# Patient Record
Sex: Male | Born: 1970
Health system: Northeastern US, Academic
[De-identification: ages and names within clinical notes are randomized; demographics above are authoritative.]

## PROBLEM LIST (undated history)

## (undated) DIAGNOSIS — R319 Hematuria, unspecified: Secondary | ICD-10-CM

## (undated) DIAGNOSIS — E785 Hyperlipidemia, unspecified: Secondary | ICD-10-CM

## (undated) DIAGNOSIS — D6949 Other primary thrombocytopenia: Secondary | ICD-10-CM

## (undated) HISTORY — DX: Hyperlipidemia, unspecified: E78.5

## (undated) HISTORY — DX: Hematuria, unspecified: R31.9

## (undated) HISTORY — DX: Other primary thrombocytopenia: D69.49

---

## 2012-07-31 ENCOUNTER — Encounter: Payer: Self-pay | Admitting: *Deleted

## 2012-08-05 ENCOUNTER — Ambulatory Visit: Payer: Self-pay | Admitting: General Surgery

## 2012-09-09 ENCOUNTER — Encounter: Payer: Self-pay | Admitting: *Deleted

## 2013-07-08 HISTORY — PX: VASECTOMY: SHX75

## 2014-01-05 ENCOUNTER — Encounter: Payer: Self-pay | Admitting: *Deleted

## 2014-01-26 ENCOUNTER — Encounter: Payer: Self-pay | Admitting: *Deleted

## 2014-01-26 ENCOUNTER — Ambulatory Visit: Payer: Self-pay | Admitting: General Surgery

## 2014-08-09 ENCOUNTER — Encounter: Payer: Self-pay | Admitting: Family Medicine

## 2014-08-16 DIAGNOSIS — E785 Hyperlipidemia, unspecified: Secondary | ICD-10-CM | POA: Insufficient documentation

## 2014-08-16 DIAGNOSIS — D6949 Other primary thrombocytopenia: Secondary | ICD-10-CM | POA: Insufficient documentation

## 2014-08-16 DIAGNOSIS — R319 Hematuria, unspecified: Secondary | ICD-10-CM | POA: Insufficient documentation

## 2014-08-31 ENCOUNTER — Encounter: Payer: Self-pay | Admitting: Family Medicine

## 2014-09-22 ENCOUNTER — Encounter: Payer: Self-pay | Admitting: Family Medicine

## 2014-09-24 ENCOUNTER — Encounter: Payer: Self-pay | Admitting: Family Medicine

## 2014-12-28 ENCOUNTER — Ambulatory Visit (INDEPENDENT_AMBULATORY_CARE_PROVIDER_SITE_OTHER): Payer: Self-pay | Admitting: Family Medicine

## 2014-12-28 ENCOUNTER — Encounter: Payer: Self-pay | Admitting: Family Medicine

## 2014-12-28 VITALS — BP 109/80 | HR 76 | Temp 97.9°F | Ht 74.0 in | Wt 201.0 lb

## 2014-12-28 DIAGNOSIS — R319 Hematuria, unspecified: Secondary | ICD-10-CM

## 2014-12-28 DIAGNOSIS — Z0289 Encounter for other administrative examinations: Secondary | ICD-10-CM

## 2014-12-28 DIAGNOSIS — Z0000 Encounter for general adult medical examination without abnormal findings: Secondary | ICD-10-CM

## 2014-12-28 DIAGNOSIS — E785 Hyperlipidemia, unspecified: Secondary | ICD-10-CM

## 2014-12-28 DIAGNOSIS — R809 Proteinuria, unspecified: Secondary | ICD-10-CM

## 2014-12-28 DIAGNOSIS — Z125 Encounter for screening for malignant neoplasm of prostate: Secondary | ICD-10-CM | POA: Insufficient documentation

## 2014-12-28 LAB — MICROSCOPIC EXAMINATION
Epithelial Cells (non renal): NONE SEEN /hpf (ref 0–10)
WBC, UA: NONE SEEN /hpf (ref 0–?)

## 2014-12-28 LAB — UA/M W/RFLX CULTURE, ROUTINE
BILIRUBIN UA: NEGATIVE
GLUCOSE, UA: NEGATIVE
KETONES UA: NEGATIVE
LEUKOCYTES UA: NEGATIVE
Nitrite, UA: NEGATIVE
PROTEIN UA: NEGATIVE
SPEC GRAV UA: 1.015 (ref 1.005–1.030)
Urobilinogen, Ur: 0.2 mg/dL (ref 0.2–1.0)
pH, UA: 7 (ref 5.0–7.5)

## 2014-12-28 NOTE — Assessment & Plan Note (Signed)
Foster care form reviewed with patient, completed today

## 2014-12-28 NOTE — Assessment & Plan Note (Signed)
Check PSA. ?

## 2014-12-28 NOTE — Assessment & Plan Note (Addendum)
Check lipid panel; reviewed last lipid panel with him from [facility]; his issue last lab was the elevated LDL; healthy eating encouraged; he is active and just about healthy weight

## 2014-12-28 NOTE — Patient Instructions (Addendum)
We'll have you see Dr. Edwyna Shell at [facility] Try to limit saturated fats in your diet (bologna, hot dogs, barbeque, cheeseburgers, hamburgers, steak, bacon, sausage, cheese, etc.) and get more fresh fruits, vegetables, and whole grains Return in one year for your next physical Health Maintenance, Male A healthy lifestyle and preventative care can promote health and wellness.  Maintain regular health, dental, and eye exams.  Eat a healthy diet. Foods like vegetables, fruits, whole grains, low-fat dairy products, and lean protein foods contain the nutrients you need and are low in calories. Decrease your intake of foods high in solid fats, added sugars, and salt. Get information about a proper diet from your health care provider, if necessary.  Regular physical exercise is one of the most important things you can do for your health. Most adults should get at least 150 minutes of moderate-intensity exercise (any activity that increases your heart rate and causes you to sweat) each week. In addition, most adults need muscle-strengthening exercises on 2 or more days a week.   Maintain a healthy weight. The body mass index (BMI) is a screening tool to identify possible weight problems. It provides an estimate of body fat based on height and weight. Your health care provider can find your BMI and can help you achieve or maintain a healthy weight. For males 20 years and older:  A BMI below 18.5 is considered underweight.  A BMI of 18.5 to 24.9 is normal.  A BMI of 25 to 29.9 is considered overweight.  A BMI of 30 and above is considered obese.  Maintain normal blood lipids and cholesterol by exercising and minimizing your intake of saturated fat. Eat a balanced diet with plenty of fruits and vegetables. Blood tests for lipids and cholesterol should begin at age 49 and be repeated every 5 years. If your lipid or cholesterol levels are high, you are over age 75, or you are at high risk for heart  disease, you may need your cholesterol levels checked more frequently.Ongoing high lipid and cholesterol levels should be treated with medicines if diet and exercise are not working.  If you smoke, find out from your health care provider how to quit. If you do not use tobacco, do not start.  Lung cancer screening is recommended for adults aged 55-80 years who are at high risk for developing lung cancer because of a history of smoking. A yearly low-dose CT scan of the lungs is recommended for people who have at least a 30-pack-year history of smoking and are current smokers or have quit within the past 15 years. A pack year of smoking is smoking an average of 1 pack of cigarettes a day for 1 year (for example, a 30-pack-year history of smoking could mean smoking 1 pack a day for 30 years or 2 packs a day for 15 years). Yearly screening should continue until the smoker has stopped smoking for at least 15 years. Yearly screening should be stopped for people who develop a health problem that would prevent them from having lung cancer treatment.  If you choose to drink alcohol, do not have more than 2 drinks per day. One drink is considered to be 12 oz (360 mL) of beer, 5 oz (150 mL) of wine, or 1.5 oz (45 mL) of liquor.  Avoid the use of street drugs. Do not share needles with anyone. Ask for help if you need support or instructions about stopping the use of drugs.  High blood pressure causes heart disease  and increases the risk of stroke. High blood pressure is more likely to develop in:  People who have blood pressure in the end of the normal range (100-139/85-89 mm Hg).  People who are overweight or obese.  People who are African American.  If you are 29-75 years of age, have your blood pressure checked every 3-5 years. If you are 69 years of age or older, have your blood pressure checked every year. You should have your blood pressure measured twice--once when you are at a hospital or clinic, and  once when you are not at a hospital or clinic. Record the average of the two measurements. To check your blood pressure when you are not at a hospital or clinic, you can use:  An automated blood pressure machine at a pharmacy.  A home blood pressure monitor.  If you are 87-75 years old, ask your health care provider if you should take aspirin to prevent heart disease.  Diabetes screening involves taking a blood sample to check your fasting blood sugar level. This should be done once every 3 years after age 65 if you are at a normal weight and without risk factors for diabetes. Testing should be considered at a younger age or be carried out more frequently if you are overweight and have at least 1 risk factor for diabetes.  Colorectal cancer can be detected and often prevented. Most routine colorectal cancer screening begins at the age of 52 and continues through age 50. However, your health care provider may recommend screening at an earlier age if you have risk factors for colon cancer. On a yearly basis, your health care provider may provide home test kits to check for hidden blood in the stool. A small camera at the end of a tube may be used to directly examine the colon (sigmoidoscopy or colonoscopy) to detect the earliest forms of colorectal cancer. Talk to your health care provider about this at age 40 when routine screening begins. A direct exam of the colon should be repeated every 5-10 years through age 44, unless early forms of precancerous polyps or small growths are found.  People who are at an increased risk for hepatitis B should be screened for this virus. You are considered at high risk for hepatitis B if:  You were born in a country where hepatitis B occurs often. Talk with your health care provider about which countries are considered high risk.  Your parents were born in a high-risk country and you have not received a shot to protect against hepatitis B (hepatitis B  vaccine).  You have HIV or AIDS.  You use needles to inject street drugs.  You live with, or have sex with, someone who has hepatitis B.  You are a man who has sex with other men (MSM).  You get hemodialysis treatment.  You take certain medicines for conditions like cancer, organ transplantation, and autoimmune conditions.  Hepatitis C blood testing is recommended for all people born from [birth year] through 1964 and any individual with known risk factors for hepatitis C.  Healthy men should no longer receive prostate-specific antigen (PSA) blood tests as part of routine cancer screening. Talk to your health care provider about prostate cancer screening.  Testicular cancer screening is not recommended for adolescents or adult males who have no symptoms. Screening includes self-exam, a health care provider exam, and other screening tests. Consult with your health care provider about any symptoms you have or any concerns you have about testicular cancer.  Practice safe sex. Use condoms and avoid high-risk sexual practices to reduce the spread of sexually transmitted infections (STIs).  You should be screened for STIs, including gonorrhea and chlamydia if:  You are sexually active and are younger than 24 years.  You are older than 24 years, and your health care provider tells you that you are at risk for this type of infection.  Your sexual activity has changed since you were last screened, and you are at an increased risk for chlamydia or gonorrhea. Ask your health care provider if you are at risk.  If you are at risk of being infected with HIV, it is recommended that you take a prescription medicine daily to prevent HIV infection. This is called pre-exposure prophylaxis (PrEP). You are considered at risk if:  You are a man who has sex with other men (MSM).  You are a heterosexual man who is sexually active with multiple partners.  You take drugs by injection.  You are sexually active  with a partner who has HIV.  Talk with your health care provider about whether you are at high risk of being infected with HIV. If you choose to begin PrEP, you should first be tested for HIV. You should then be tested every 3 months for as long as you are taking PrEP.  Use sunscreen. Apply sunscreen liberally and repeatedly throughout the day. You should seek shade when your shadow is shorter than you. Protect yourself by wearing long sleeves, pants, a wide-brimmed hat, and sunglasses year round whenever you are outdoors.  Tell your health care provider of new moles or changes in moles, especially if there is a change in shape or color. Also, tell your health care provider if a mole is larger than the size of a pencil eraser.  A one-time screening for abdominal aortic aneurysm (AAA) and surgical repair of large AAAs by ultrasound is recommended for men aged 65-75 years who are current or former smokers.  Stay current with your vaccines (immunizations).   This information is not intended to replace advice given to you by your health care provider. Make sure you discuss any questions you have with your health care provider.   Document Released: 06/23/2007 Document Revised: 01/15/2014 Document Reviewed: 05/22/2010 Elsevier Interactive Patient Education [employer]2016 Elsevier Inc.

## 2014-12-28 NOTE — Assessment & Plan Note (Addendum)
USPSTF grade A and B recommendations reviewed with patient; age-appropriate recommendations, preventive care, screening tests, etc discussed and encouraged; healthy living encouraged; see AVS for patient education given to patient; he declined flu shot today; advised him to avoid nursing homes, hospitals, day cares, etc.

## 2014-12-28 NOTE — Assessment & Plan Note (Addendum)
Father had similar issues and patient says he is okay; however, I strongly encouraged referral to urologist; patient agrees after we discussed that it may be nothing (vast majority of patients), but we don't know who has cancer and who doesn't under after evaluation, CT scan, urine cytology, cystoscopy; he agrees to go and will try to learn more about his father's history

## 2014-12-28 NOTE — Progress Notes (Signed)
Patient ID: Joshua Cohen, male   DOB: September 09, 1970, 44 y.o.   MRN: [id number]  Subjective:   Joshua Cohen is a 44 y.o. male here for a complete physical exam  Interim issues since last visit: no major health problems  USPSTF grade A and B recommendations Alcohol: social drinker, far below target Depression:  Depression screen [facility] 2/9 12/28/2014  Decreased Interest 0  Down, Depressed, Hopeless 0  PHQ - 2 Score 0   Hypertension: well-controlled Obesity: n/a Tobacco use: never HIV, hep B, hep C: politely declined STD testing and prevention (chl/gon/syphilis): politely declined Lipids: check today, truly fasting Glucose: check today, truly fasting Colorectal cancer: start at age 30 Breast cancer: no lumps, no fam hx Lung cancer: n/a Osteoporosis: n/a AAA: n/a Aspirin: n/a Diet: pretty good eater Exercise: active Skin cancer: no dark moles  Natural vitamin vegetable-based, fish oil supplements, also started probiotic a week ago He does emulsifier drink with kale, spinach, strawberries, local honey, flax seed, almonds three times a week  Past Medical History  Diagnosis Date  . Hyperlipidemia   . Hematuria   . Primary thrombocytopenia [facility])    Past Surgical History  Procedure Laterality Date  . Vasectomy  07/2013   Family History  Problem Relation Age of Onset  . Hypertension Mother   . Hyperlipidemia Mother   . COPD Neg Hx   . Cancer Neg Hx   . Diabetes Neg Hx   . Heart disease Neg Hx   . Stroke Neg Hx   Hematuria -- father  Social History  Substance Use Topics  . Smoking status: Never Smoker   . Smokeless tobacco: Never Used  . Alcohol Use: No   Review of Systems  Constitutional: Negative for unexpected weight change.  HENT: Negative for hearing loss, nosebleeds and sore throat.   Eyes: Negative for visual disturbance.  Respiratory: Negative for cough and wheezing.   Cardiovascular: Negative for chest pain and leg swelling.  Gastrointestinal: Negative  for blood in stool.  Endocrine: Negative for cold intolerance, heat intolerance, polydipsia and polyuria.  Genitourinary: Negative for urgency, hematuria, decreased urine volume, scrotal swelling, difficulty urinating and testicular pain.  Musculoskeletal: Negative for joint swelling and arthralgias.  Skin:       No worrisome moles  Allergic/Immunologic: Negative for food allergies.  Neurological: Negative for tremors.  Hematological: Does not bruise/bleed easily.  Psychiatric/Behavioral: Negative for dysphoric mood.   Objective:   Filed Vitals:   12/28/14 0810  BP: 109/80  Pulse: 76  Temp: 97.9 F (36.6 C)  Height:  (1.88 m)  Weight: 201 lb (91.173 kg)  SpO2: 100%   Body mass index is 25.8 kg/(m^2). Wt Readings from Last 3 Encounters:  12/28/14 201 lb (91.173 kg)  08/14/13 211 lb (95.709 kg)   Physical Exam  Constitutional: He appears well-developed and well-nourished. No distress.  HENT:  Head: Normocephalic and atraumatic.  Nose: Nose normal.  Mouth/Throat: Oropharynx is clear and moist.  Eyes: EOM are normal. No scleral icterus.  Neck: No JVD present. No thyromegaly present.  Cardiovascular: Normal rate, regular rhythm and normal heart sounds.   Pulmonary/Chest: Effort normal and breath sounds normal. No respiratory distress. He has no wheezes. He has no rales.  Abdominal: Soft. Bowel sounds are normal. He exhibits no distension. There is no tenderness. There is no guarding.  Musculoskeletal: Normal range of motion. He exhibits no edema.  Lymphadenopathy:    He has no cervical adenopathy.  Neurological: He is alert. He displays normal reflexes.  He exhibits normal muscle tone. Coordination normal.  Skin: Skin is warm and dry. No rash noted. He is not diaphoretic. No erythema. No pallor.  Psychiatric: He has a normal mood and affect. His behavior is normal. Judgment and thought content normal.    Assessment/Plan:   Problem List Items Addressed This Visit       Other   Hyperlipidemia    Check lipid panel; reviewed last lipid panel with him from [facility]; his issue last lab was the elevated LDL; healthy eating encouraged; he is active and just about healthy weight      Hematuria    Father had similar issues and patient says he is okay; however, I strongly encouraged referral to urologist; patient agrees after we discussed that it may be nothing (vast majority of patients), but we don't know who has cancer and who doesn't under after evaluation, CT scan, urine cytology, cystoscopy; he agrees to go and will try to learn more about his father's history      Relevant Orders   Ambulatory referral to Urology   UA/M w/rflx Culture, Routine (Completed)   Microscopic Examination (Completed)   Preventative health care - Primary    USPSTF grade A and B recommendations reviewed with patient; age-appropriate recommendations, preventive care, screening tests, etc discussed and encouraged; healthy living encouraged; see AVS for patient education given to patient; he declined flu shot today; advised him to avoid nursing homes, hospitals, day cares, etc.      Relevant Orders   Lipid Panel w/o Chol/HDL Ratio   CBC with Differential/Platelet   Microalbumin / creatinine urine ratio   Comprehensive metabolic panel   Prostate cancer screening    Check PSA      Relevant Orders   PSA   Protein in urine    He has 1+ protein in previous urine; will recheck urinalysis and urine microalbumin      Encounter for completion of form with patient    [facility]         Follow up plan: Return if symptoms worsen or fail to improve. An after-visit summary was printed and given to the patient at check-out.  Please see the patient instructions which may contain other information and recommendations beyond what is mentioned above in the assessment and plan.  Orders Placed This Encounter  Procedures  .  Microscopic Examination  . Lipid Panel w/o Chol/HDL Ratio  . CBC with Differential/Platelet  . Microalbumin / creatinine urine ratio  . UA/M w/rflx Culture, Routine  . PSA  . Comprehensive metabolic panel  . Ambulatory referral to Urology

## 2014-12-28 NOTE — Assessment & Plan Note (Signed)
He has 1+ protein in previous urine; will recheck urinalysis and urine microalbumin

## 2014-12-29 LAB — CBC WITH DIFFERENTIAL/PLATELET
BASOS: 0 %
Basophils Absolute: 0 10*3/uL (ref 0.0–0.2)
EOS (ABSOLUTE): 0.2 10*3/uL (ref 0.0–0.4)
Eos: 4 %
Hematocrit: 39.8 % (ref 37.5–51.0)
Hemoglobin: 14 g/dL (ref 12.6–17.7)
IMMATURE GRANS (ABS): 0 10*3/uL (ref 0.0–0.1)
IMMATURE GRANULOCYTES: 0 %
LYMPHS: 49 %
Lymphocytes Absolute: 2.4 10*3/uL (ref 0.7–3.1)
MCH: 30.7 pg (ref 26.6–33.0)
MCHC: 35.2 g/dL (ref 31.5–35.7)
MCV: 87 fL (ref 79–97)
MONOS ABS: 0.2 10*3/uL (ref 0.1–0.9)
Monocytes: 5 %
NEUTROS PCT: 42 %
Neutrophils Absolute: 2.1 10*3/uL (ref 1.4–7.0)
PLATELETS: 155 10*3/uL (ref 150–379)
RBC: 4.56 x10E6/uL (ref 4.14–5.80)
RDW: 13.3 % (ref 12.3–15.4)
WBC: 4.9 10*3/uL (ref 3.4–10.8)

## 2014-12-29 LAB — COMPREHENSIVE METABOLIC PANEL
A/G RATIO: 1.9 (ref 1.1–2.5)
ALK PHOS: 54 IU/L (ref 39–117)
ALT: 20 IU/L (ref 0–44)
AST: 19 IU/L (ref 0–40)
Albumin: 4.5 g/dL (ref 3.5–5.5)
BILIRUBIN TOTAL: 0.4 mg/dL (ref 0.0–1.2)
BUN/Creatinine Ratio: 12 (ref 9–20)
BUN: 14 mg/dL (ref 6–24)
CHLORIDE: 100 mmol/L (ref 96–106)
CO2: 25 mmol/L (ref 18–29)
Calcium: 9.5 mg/dL (ref 8.7–10.2)
Creatinine, Ser: 1.16 mg/dL (ref 0.76–1.27)
GFR calc Af Amer: 89 mL/min/{1.73_m2} (ref >59)
GFR, EST NON AFRICAN AMERICAN: 77 mL/min/{1.73_m2} (ref >59)
GLOBULIN, TOTAL: 2.4 g/dL (ref 1.5–4.5)
Glucose: 96 mg/dL (ref 65–99)
POTASSIUM: 4.8 mmol/L (ref 3.5–5.2)
SODIUM: 140 mmol/L (ref 134–144)
Total Protein: 6.9 g/dL (ref 6.0–8.5)

## 2014-12-29 LAB — LIPID PANEL W/O CHOL/HDL RATIO
Cholesterol, Total: 253 mg/dL — ABNORMAL HIGH (ref 100–199)
HDL: 63 mg/dL (ref >39)
LDL Calculated: 164 mg/dL — ABNORMAL HIGH (ref 0–99)
Triglycerides: 132 mg/dL (ref 0–149)
VLDL CHOLESTEROL CAL: 26 mg/dL (ref 5–40)

## 2014-12-29 LAB — MICROALBUMIN / CREATININE URINE RATIO
CREATININE, UR: 144.8 mg/dL
MICROALB/CREAT RATIO: 7.3 mg/g{creat} (ref 0.0–30.0)
MICROALBUM., U, RANDOM: 10.5 ug/mL

## 2014-12-29 LAB — PSA: PROSTATE SPECIFIC AG, SERUM: 1.7 ng/mL (ref 0.0–4.0)

## 2014-12-31 ENCOUNTER — Encounter: Payer: Self-pay | Admitting: Family Medicine

## 2014-12-31 ENCOUNTER — Ambulatory Visit: Payer: Self-pay | Admitting: Urology

## 2014-12-31 DIAGNOSIS — E785 Hyperlipidemia, unspecified: Secondary | ICD-10-CM

## 2014-12-31 DIAGNOSIS — Z5181 Encounter for therapeutic drug level monitoring: Secondary | ICD-10-CM

## 2014-12-31 MED ORDER — ATORVASTATIN CALCIUM 10 MG PO TABS: 10.0000 mg | ORAL_TABLET | Freq: Every day | ORAL | Status: DC

## 2015-01-05 ENCOUNTER — Telehealth: Payer: Self-pay | Admitting: Family Medicine

## 2015-01-05 ENCOUNTER — Encounter: Payer: Self-pay | Admitting: Obstetrics and Gynecology

## 2015-01-05 ENCOUNTER — Ambulatory Visit (INDEPENDENT_AMBULATORY_CARE_PROVIDER_SITE_OTHER): Payer: Self-pay | Admitting: Obstetrics and Gynecology

## 2015-01-05 VITALS — BP 125/80 | HR 82 | Resp 16 | Ht 74.0 in | Wt 204.7 lb

## 2015-01-05 DIAGNOSIS — R3129 Other microscopic hematuria: Secondary | ICD-10-CM

## 2015-01-05 DIAGNOSIS — E785 Hyperlipidemia, unspecified: Secondary | ICD-10-CM

## 2015-01-05 LAB — URINALYSIS, COMPLETE
BILIRUBIN UA: NEGATIVE
GLUCOSE, UA: NEGATIVE
LEUKOCYTES UA: NEGATIVE
NITRITE UA: NEGATIVE
SPEC GRAV UA: 1.025 (ref 1.005–1.030)
Urobilinogen, Ur: 0.2 mg/dL (ref 0.2–1.0)
pH, UA: 7 (ref 5.0–7.5)

## 2015-01-05 LAB — MICROSCOPIC EXAMINATION: EPITHELIAL CELLS (NON RENAL): NONE SEEN /HPF (ref 0–10)

## 2015-01-05 NOTE — Assessment & Plan Note (Signed)
Check fasting lipids after 3 months of TLC

## 2015-01-05 NOTE — Patient Instructions (Signed)

## 2015-01-05 NOTE — Telephone Encounter (Signed)
Patient saw urologist, medicine was taken off med list Please call patient and see if he got my letter, does he not want to take medicine? I do recommend, but if he doesn't want to, then let's at least have him try diet changes and recheck in 3 months I entered future orders for March and released them

## 2015-01-05 NOTE — Progress Notes (Signed)
01/05/2015 2:47 PM   Joshua Cohen 01-Mar-1970 [id number]  Referring provider: Kerman Passey, MD [address] [place], [place] [address]  Chief Complaint  Patient presents with  . Hematuria    HPI: Patient is a 44 year old African-American male presenting today as a referral from his primary care provider for recurrent episodes of microscopic hematuria. Patient denies any episodes of gross hematuria. He reports no dysuria, frequency, urgency, sensation of incomplete bladder emptying, flank pain or fevers.  He reports that his father also has a history of microscopic hematuria and has undergone a negative hematuria workup in the past.  Never a smoker. No hisotry of renal stones. No family history of GU or prostate cancers.  Prostate cancer screening performed by PCP.   PMH: Past Medical History  Diagnosis Date  . Hyperlipidemia   . Hematuria   . Primary thrombocytopenia [facility])     Surgical History: Past Surgical History  Procedure Laterality Date  . Vasectomy  07/2013    Home Medications:    Medication List    Notice  As of 01/05/2015  2:47 PM   You have not been prescribed any medications.      Allergies: No Known Allergies  Family History: Family History  Problem Relation Age of Onset  . Hypertension Mother   . Hyperlipidemia Mother   . COPD Neg Hx   . Cancer Neg Hx   . Diabetes Neg Hx   . Heart disease Neg Hx   . Stroke Neg Hx     Social History:  reports that he has never smoked. He has never used smokeless tobacco. He reports that he does not drink alcohol or use illicit drugs.  ROS: UROLOGY Frequent Urination?: No Hard to postpone urination?: No Burning/pain with urination?: No Get up at night to urinate?: No Leakage of urine?: No Urine stream starts and stops?: No Trouble starting stream?: No Do you have to strain to urinate?: No Blood in urine?: Yes Urinary tract infection?: No Sexually transmitted disease?: No Injury to kidneys or bladder?:  No Painful intercourse?: No Weak stream?: No Erection problems?: No Penile pain?: No  Gastrointestinal Nausea?: No Vomiting?: No Indigestion/heartburn?: No Diarrhea?: No Constipation?: No  Constitutional Fever: No Night sweats?: No Weight loss?: No Fatigue?: No  Skin Skin rash/lesions?: No Itching?: No  Eyes Blurred vision?: No Double vision?: No  Ears/Nose/Throat Sore throat?: No Sinus problems?: No  Hematologic/Lymphatic Swollen glands?: No Easy bruising?: No  Cardiovascular Leg swelling?: No Chest pain?: No  Respiratory Cough?: No Shortness of breath?: No  Endocrine Excessive thirst?: No  Musculoskeletal Back pain?: No Joint pain?: No  Neurological Headaches?: No Dizziness?: No  Psychologic Depression?: No Anxiety?: No  Physical Exam: BP 125/80 mmHg  Pulse 82  Resp 16  Ht  (1.88 m)  Wt 204 lb 11.2 oz (92.851 kg)  BMI 26.27 kg/m2  Constitutional:  Alert and oriented, No acute distress. HEENT: [place] AT, moist mucus membranes.  Trachea midline, no masses. Cardiovascular: No clubbing, cyanosis, or edema. Respiratory: Normal respiratory effort, no increased work of breathing. GI: Abdomen is soft, nontender, nondistended, no abdominal masses GU: No CVA tenderness.  Skin: No rashes, bruises or suspicious lesions. Lymph: No cervical or inguinal adenopathy. Neurologic: Grossly intact, no focal deficits, moving all 4 extremities. Psychiatric: Normal mood and affect.  Laboratory Data:   Urinalysis Results for orders placed or performed in visit on 01/05/15  Microscopic Examination  Result Value Ref Range   WBC, UA 0-5 0 -  5 /  hpf   RBC, UA 3-10 (A) 0 -  2 /hpf   Epithelial Cells (non renal) None seen 0 - 10 /hpf   Mucus, UA Present (A) Not Estab.   Bacteria, UA Few (A) None seen/Few  Urinalysis, Complete  Result Value Ref Range   Specific Gravity, UA 1.025 1.005 - 1.030   pH, UA 7.0 5.0 - 7.5   Color, UA Yellow Yellow   Appearance  Ur Clear Clear   Leukocytes, UA Negative Negative   Protein, UA Trace (A) Negative/Trace   Glucose, UA Negative Negative   Ketones, UA Trace (A) Negative   RBC, UA 2+ (A) Negative   Bilirubin, UA Negative Negative   Urobilinogen, Ur 0.2 0.2 - 1.0 mg/dL   Nitrite, UA Negative Negative   Microscopic Examination See below:     Pertinent Imaging:   Assessment & Plan:    1. Microscopic Hematuria- We discussed the differential diagnosis for microscopic hematuria including nephrolithiasis, renal or upper tract tumors, bladder stones, UTIs, or bladder tumors as well as undetermined etiologies. Per AUA guidelines, I did recommend complete microscopic hematuria evaluation including CTU, possible urine cytology, and office cystoscopy. - Urinalysis, Complete -CT Urogram -cystoscopy  Return for CT Urogram results and cystoscopy.  These notes generated with voice recognition software. I apologize for typographical errors.  Earlie LouLindsay Collen Hostler, FNP  [facility] [address], [address] [place], [place] [address] [phone]

## 2015-01-06 NOTE — Telephone Encounter (Signed)
Patient left message that he just got his lab results letter in the mail.

## 2015-01-06 NOTE — Telephone Encounter (Signed)
Left message to call.

## 2015-01-07 NOTE — Telephone Encounter (Signed)
I spoke with patient, he really doesn't want to take cholesterol medicine yet. He is going to "reset" and watch his diet a lil more closely and then come back in for labs in 2-3 months.

## 2015-02-01 ENCOUNTER — Other Ambulatory Visit: Payer: Self-pay | Admitting: Urology

## 2015-02-09 ENCOUNTER — Other Ambulatory Visit: Payer: Self-pay | Admitting: Obstetrics and Gynecology

## 2015-02-09 DIAGNOSIS — R3129 Other microscopic hematuria: Secondary | ICD-10-CM

## 2015-02-18 ENCOUNTER — Ambulatory Visit: Admission: RE | Admit: 2015-02-18 | Payer: Self-pay | Source: Ambulatory Visit

## 2015-02-18 ENCOUNTER — Ambulatory Visit
Admission: RE | Admit: 2015-02-18 | Discharge: 2015-02-18 | Disposition: A | Discharge status: Home / Self Care | Payer: BLUE CROSS/BLUE SHIELD | Source: Ambulatory Visit | Attending: Obstetrics and Gynecology | Admitting: Obstetrics and Gynecology

## 2015-02-18 DIAGNOSIS — R3129 Other microscopic hematuria: Secondary | ICD-10-CM | POA: Diagnosis not present

## 2015-02-18 MED ORDER — IOHEXOL 350 MG/ML SOLN
150.0000 mL | Freq: Once | INTRAVENOUS | Status: AC | PRN
  Administered 2015-02-18: 150 mL via INTRAVENOUS

## 2015-03-04 ENCOUNTER — Encounter: Payer: Self-pay | Admitting: Urology

## 2015-03-04 ENCOUNTER — Other Ambulatory Visit: Payer: BLUE CROSS/BLUE SHIELD | Admitting: Urology

## 2015-03-23 ENCOUNTER — Ambulatory Visit (INDEPENDENT_AMBULATORY_CARE_PROVIDER_SITE_OTHER): Payer: BLUE CROSS/BLUE SHIELD | Admitting: Urology

## 2015-03-23 VITALS — BP 136/87 | HR 79 | Ht 74.0 in | Wt 201.0 lb

## 2015-03-23 DIAGNOSIS — R319 Hematuria, unspecified: Secondary | ICD-10-CM | POA: Diagnosis not present

## 2015-03-23 DIAGNOSIS — R3129 Other microscopic hematuria: Secondary | ICD-10-CM | POA: Diagnosis not present

## 2015-03-23 LAB — URINALYSIS, COMPLETE
BILIRUBIN UA: NEGATIVE
GLUCOSE, UA: NEGATIVE
Ketones, UA: NEGATIVE
Leukocytes, UA: NEGATIVE
NITRITE UA: NEGATIVE
PH UA: 6 (ref 5.0–7.5)
PROTEIN UA: NEGATIVE
Specific Gravity, UA: 1.025 (ref 1.005–1.030)
UUROB: 0.2 mg/dL (ref 0.2–1.0)

## 2015-03-23 LAB — MICROSCOPIC EXAMINATION
Bacteria, UA: NONE SEEN
Epithelial Cells (non renal): NONE SEEN /hpf (ref 0–10)

## 2015-03-23 MED ORDER — CIPROFLOXACIN HCL 500 MG PO TABS
500.0000 mg | ORAL_TABLET | Freq: Once | ORAL | Status: AC
  Administered 2015-03-23: 500 mg via ORAL

## 2015-03-23 MED ORDER — LIDOCAINE HCL 2 % EX GEL
1.0000 "application " | Freq: Once | CUTANEOUS | Status: AC
  Administered 2015-03-23: 1 via URETHRAL

## 2015-03-23 NOTE — Progress Notes (Signed)
3:59 PM  03/23/15   Joshua Cohen [birth year] [id number]  Referring provider: Kerman Passey, MD [address] [place], [place] [address]  Chief Complaint  Patient presents with  . Cysto    HPI: 45 year old African-American male referred from his primary care provider for recurrent episodes of microscopic hematuria. Patient denies any episodes of gross hematuria. He reports no dysuria, frequency, urgency, sensation of incomplete bladder emptying, flank pain or fevers.  He reports that his father also has a history of microscopic hematuria and has undergone a negative hematuria workup in the past.  Never a smoker. No history of renal stones. No family history of GU or prostate cancers.  Prostate cancer screening performed by PCP.   CT urogram on 02/18/2015 shows no GU pathology.  PMH: Past Medical History  Diagnosis Date  . Hyperlipidemia   . Hematuria   . Primary thrombocytopenia [facility])     Surgical History: Past Surgical History  Procedure Laterality Date  . Vasectomy  07/2013    Home Medications:    Medication List    Notice  As of 03/23/2015 11:59 PM   You have not been prescribed any medications.      Allergies: No Known Allergies  Family History: Family History  Problem Relation Age of Onset  . Hypertension Mother   . Hyperlipidemia Mother   . COPD Neg Hx   . Cancer Neg Hx   . Diabetes Neg Hx   . Heart disease Neg Hx   . Stroke Neg Hx     Social History:  reports that he has never smoked. He has never used smokeless tobacco. He reports that he does not drink alcohol or use illicit drugs.   Physical Exam: BP 136/87 mmHg  Pulse 79  Ht  (1.88 m)  Wt 201 lb (91.173 kg)  BMI 25.80 kg/m2  Constitutional:  Alert and oriented, No acute distress. HEENT: [place] AT, moist mucus membranes.  Trachea midline, no masses. Cardiovascular: No clubbing, cyanosis, or edema. Respiratory: Normal respiratory effort, no increased work of breathing. GI: Abdomen is soft,  nontender, nondistended, no abdominal masses GU: No CVA tenderness. Normal phallus with orthotopic meatus. Skin: No rashes, bruises or suspicious lesions. Neurologic: Grossly intact, no focal deficits, moving all 4 extremities. Psychiatric: Normal mood and affect.  Laboratory Data:   Urinalysis Results for orders placed or performed in visit on 03/23/15  Microscopic Examination  Result Value Ref Range   WBC, UA 0-5 0 -  5 /hpf   RBC, UA 11-30 (A) 0 -  2 /hpf   Epithelial Cells (non renal) None seen 0 - 10 /hpf   Bacteria, UA None seen None seen/Few  Urinalysis, Complete  Result Value Ref Range   Specific Gravity, UA 1.025 1.005 - 1.030   pH, UA 6.0 5.0 - 7.5   Color, UA Yellow Yellow   Appearance Ur Clear Clear   Leukocytes, UA Negative Negative   Protein, UA Negative Negative/Trace   Glucose, UA Negative Negative   Ketones, UA Negative Negative   RBC, UA 2+ (A) Negative   Bilirubin, UA Negative Negative   Urobilinogen, Ur 0.2 0.2 - 1.0 mg/dL   Nitrite, UA Negative Negative   Microscopic Examination See below:     Pertinent Imaging: Study Result     CLINICAL DATA: Microscopic hematuria.  EXAM: CT ABDOMEN AND PELVIS WITHOUT AND WITH CONTRAST  TECHNIQUE: Multidetector CT imaging of the abdomen and pelvis was performed following the standard protocol before and following the bolus  administration of intravenous contrast.  CONTRAST: 150mL OMNIPAQUE IOHEXOL 350 MG/ML SOLN  COMPARISON: None.  FINDINGS: Lower chest: No significant pulmonary nodules or acute consolidative airspace disease.  Hepatobiliary: Normal liver with no liver mass. Normal gallbladder with no radiopaque cholelithiasis. No biliary ductal dilatation.  Pancreas: Normal, with no mass or duct dilation.  Spleen: Normal size. No mass.  Adrenals/Urinary Tract: Normal adrenals. No hydronephrosis. No renal stones. Normal caliber ureters, with no ureteral stones. Hypodense 0.5 cm renal  cortical lesion in the anterior lower right kidney, too small to characterize. Two hypodense subcentimeter renal cortical lesions in the left kidney, too small to characterize. On delayed imaging, there is no urothelial wall thickening and there are no filling defects in the opacified portions of the bilateral collecting systems or ureters. Normal bladder.  Stomach/Bowel: Grossly normal stomach. Normal caliber small bowel with no small bowel wall thickening. Appendix is within normal limits. Normal large bowel with no diverticulosis, large bowel wall thickening or pericolonic fat stranding.  Vascular/Lymphatic: Normal caliber abdominal aorta. Patent portal, splenic, hepatic and renal veins. No pathologically enlarged lymph nodes in the abdomen or pelvis.  Reproductive: Top-normal size prostate.  Other: No pneumoperitoneum, ascites or focal fluid collection.  Musculoskeletal: No aggressive appearing focal osseous lesions.  IMPRESSION: No CT findings to explain the hematuria. No urolithiasis. No evidence of urinary tract obstruction. Subcentimeter too small to characterize hypodense renal cortical lesions, with no overtly suspicious renal cortical masses. No urothelial lesions.   Electronically Signed  By: Delbert PhenixJason A Poff M.D.  On: 02/18/2015 09:20       Cystoscopy Procedure Note  Patient identification was confirmed, informed consent was obtained, and patient was prepped using Betadine solution.  Lidocaine jelly was administered per urethral meatus.    Preoperative abx where received prior to procedure.     Pre-Procedure: - Inspection reveals a normal caliber ureteral meatus.  Procedure: The flexible cystoscope was introduced without difficulty - No urethral strictures/lesions are present. - Enlarged prostate with bilobar coaptation, 4 cm prostatic length - Elevated bladder neck - Bilateral ureteral orifices identified - Bladder mucosa  reveals no ulcers,  tumors, or lesions - No bladder stones - No trabeculation  Retroflexion shows mild intravesical mass effect of prostate base of bladder   Post-Procedure: - Patient tolerated the procedure well   Assessment & Plan:    1. Microscopic Hematuria-  Status post negative CT urogram/cystoscopy Mild prostatic enlargement with intravesical protrusion of the prostate, asymptomatic Findings reviewed with the patient and his wife, all questions were answered  Recommend follow-up in 3-5 years inspection of the hematuria persists  Vanna ScotlandAshley Airel Magadan, MD  [facility] [address], [address] [place], [place] [address] [phone]

## 2015-03-27 ENCOUNTER — Encounter: Payer: Self-pay | Admitting: Urology

## 2015-03-29 ENCOUNTER — Telehealth: Payer: Self-pay | Admitting: Family Medicine

## 2015-03-29 DIAGNOSIS — E785 Hyperlipidemia, unspecified: Secondary | ICD-10-CM

## 2015-03-29 NOTE — Assessment & Plan Note (Signed)
Check fasting labs after dietary changes

## 2015-03-29 NOTE — Telephone Encounter (Signed)
Please let Joshua Cohen know that we'd like to see patient for a lab appointment here in the office for his cholesterol Please schedule a visit with the lab in the next: week or so Fasting?  Yes please Thank you, Dr. Sherie DonLada

## 2015-03-31 ENCOUNTER — Other Ambulatory Visit: Payer: BLUE CROSS/BLUE SHIELD

## 2015-03-31 DIAGNOSIS — E785 Hyperlipidemia, unspecified: Secondary | ICD-10-CM

## 2015-04-01 LAB — LIPID PANEL W/O CHOL/HDL RATIO
CHOLESTEROL TOTAL: 196 mg/dL (ref 100–199)
HDL: 55 mg/dL (ref >39)
LDL CALC: 123 mg/dL — AB (ref 0–99)
Triglycerides: 91 mg/dL (ref 0–149)
VLDL CHOLESTEROL CAL: 18 mg/dL (ref 5–40)

## 2015-04-07 NOTE — Telephone Encounter (Signed)
This was addressed and just not signed off Closing note

## 2015-09-09 ENCOUNTER — Telehealth: Payer: Self-pay | Admitting: Family Medicine

## 2015-09-13 NOTE — Telephone Encounter (Signed)
Patient verbally informed °

## 2015-10-12 ENCOUNTER — Encounter: Payer: Self-pay | Admitting: Family Medicine

## 2015-10-12 ENCOUNTER — Ambulatory Visit (INDEPENDENT_AMBULATORY_CARE_PROVIDER_SITE_OTHER): Payer: Self-pay | Admitting: Family Medicine

## 2015-10-12 VITALS — BP 120/74 | HR 84 | Temp 97.5°F | Resp 16 | Ht 74.0 in | Wt 206.0 lb

## 2015-10-12 DIAGNOSIS — R319 Hematuria, unspecified: Secondary | ICD-10-CM

## 2015-10-12 DIAGNOSIS — R808 Other proteinuria: Secondary | ICD-10-CM

## 2015-10-12 DIAGNOSIS — Z125 Encounter for screening for malignant neoplasm of prostate: Secondary | ICD-10-CM

## 2015-10-12 DIAGNOSIS — Z0000 Encounter for general adult medical examination without abnormal findings: Secondary | ICD-10-CM

## 2015-10-12 NOTE — Progress Notes (Signed)
Patient ID: Joshua Cohen, male   DOB: January 14, 1970, 45 y.o.   MRN: [id number]   Subjective:   Joshua Cohen is a 45 y.o. male here for a complete physical exam  Interim issues since last visit: no medical excitement  USPSTF grade A and B recommendations Alcohol: special occasiona Depression:  Depression screen [facility] 2[facility] 10/12/2015 12/28/2014  Decreased Interest 0 0  Down, Depressed, Hopeless 0 0  PHQ - 2 Score 0 0   Hypertension: controlled Obesity: no Tobacco use: no  HIV, hep B, hep C: declined STD testing and prevention (chl/gon/syphilis): declined Lipids: check today Glucose: check today Colorectal cancer: no fam hx, start at 45 Breast cancer: no lumps Lung cancer: n/a Osteoporosis: n/a AAA: n/a Aspirin: n/a Diet: almost no red meat; wife is vegetarian, eating better Exercise: active Skin cancer: no worrisome moles   Past Medical History:  Diagnosis Date  . Hematuria   . Hyperlipidemia   . Primary thrombocytopenia (HCC)    Past Surgical History:  Procedure Laterality Date  . VASECTOMY  07/2013   Family History  Problem Relation Age of Onset  . Hypertension Mother   . Hyperlipidemia Mother   . COPD Neg Hx   . Cancer Neg Hx   . Diabetes Neg Hx   . Heart disease Neg Hx   . Stroke Neg Hx    Social History  Substance Use Topics  . Smoking status: Never Smoker  . Smokeless tobacco: Never Used  . Alcohol use No   Review of Systems  Constitutional: Negative for unexpected weight change.  HENT: Negative for hearing loss.   Eyes: Negative for visual disturbance.  Respiratory: Negative for shortness of breath and wheezing.   Cardiovascular: Negative for chest pain, palpitations and leg swelling.  Endocrine: Negative for cold intolerance, heat intolerance, polydipsia and polyuria.  Genitourinary: Negative for decreased urine volume, frequency, hematuria and urgency.  Musculoskeletal: Negative for arthralgias.  Allergic/Immunologic: Negative for food allergies.   Neurological: Negative for headaches.  Psychiatric/Behavioral: Negative for dysphoric mood.   He went to urologist; had cystoscopy for the blood in urine; no issues, no f/u planned  Objective:   Vitals:   10/12/15 0822  BP: 120/74  Pulse: 84  Resp: 16  Temp: 97.5 F (36.4 C)  TempSrc: Oral  SpO2: 97%  Weight: 206 lb (93.4 kg)  Height: 6\' 2"  (1.88 m)   Body mass index is 26.45 kg/m. Wt Readings from Last 3 Encounters:  10/12/15 206 lb (93.4 kg)  03/23/15 201 lb (91.2 kg)  01/05/15 204 lb 11.2 oz (92.9 kg)   Physical Exam  Constitutional: He appears well-developed and well-nourished. No distress.  HENT:  Head: Normocephalic and atraumatic.  Nose: Nose normal.  Mouth/Throat: Oropharynx is clear and moist.  Eyes: EOM are normal. No scleral icterus.  Neck: No JVD present. No thyromegaly present.  Cardiovascular: Normal rate, regular rhythm and normal heart sounds.   Pulmonary/Chest: Effort normal and breath sounds normal. No respiratory distress. He has no wheezes. He has no rales.  Abdominal: Soft. Bowel sounds are normal. He exhibits no distension. There is no tenderness. There is no guarding.  Musculoskeletal: Normal range of motion. He exhibits no edema.  Lymphadenopathy:    He has no cervical adenopathy.  Neurological: He is alert. He displays normal reflexes. He exhibits normal muscle tone. Coordination normal.  Skin: Skin is warm and dry. No rash noted. He is not diaphoretic. No erythema. No pallor.  Psychiatric: He has a normal mood and affect. His  behavior is normal. Judgment and thought content normal.    Assessment/Plan:   Problem List Items Addressed This Visit      Other   Protein in urine    Check urine      Relevant Orders   Microalbumin / creatinine urine ratio (Completed)   Prostate cancer screening    Check psa, discussed; DRE next year with colonoscopy      Relevant Orders   PSA (Completed)   Preventative health care    USPSTF grade A and  B recommendations reviewed with patient; age-appropriate recommendations, preventive care, screening tests, etc discussed and encouraged; healthy living encouraged; see AVS for patient education given to patient       Relevant Orders   CBC with Differential/Platelet (Completed)   Lipid panel (Completed)   COMPLETE METABOLIC PANEL WITH GFR (Completed)    Other Visit Diagnoses   None.     No orders of the defined types were placed in this encounter.  Orders Placed This Encounter  Procedures  . CBC with Differential/Platelet  . Lipid panel  . Microalbumin / creatinine urine ratio  . COMPLETE METABOLIC PANEL WITH GFR  . PSA    Follow up plan: Return in about 1 year (around 10/11/2016) for complete physical. An after-visit summary was printed and given to the patient at check-out.  Please see the patient instructions which may contain other information and recommendations beyond what is mentioned above in the assessment and plan.

## 2015-10-12 NOTE — Patient Instructions (Addendum)
We'll get labs today If you have not heard anything from my staff in a week about any orders/referrals/studies from today, please contact us here to follow-up [phone]  Health Maintenance, Male A healthy lifestyle and preventative care can promote health and wellness.  Maintain regular health, dental, and eye exams.  Eat a healthy diet. Foods like vegetables, fruits, whole grains, low-fat dairy products, and lean protein foods contain the nutrients you need and are low in calories. Decrease your intake of foods high in solid fats, added sugars, and salt. Get information about a proper diet from your health care provider, if necessary.  Regular physical exercise is one of the most important things you can do for your health. Most adults should get at least 150 minutes of moderate-intensity exercise (any activity that increases your heart rate and causes you to sweat) each week. In addition, most adults need muscle-strengthening exercises on 2 or more days a week.   Maintain a healthy weight. The body mass index (BMI) is a screening tool to identify possible weight problems. It provides an estimate of body fat based on height and weight. Your health care provider can find your BMI and can help you achieve or maintain a healthy weight. For males 20 years and older:  A BMI below 18.5 is considered underweight.  A BMI of 18.5 to 24.9 is normal.  A BMI of 25 to 29.9 is considered overweight.  A BMI of 30 and above is considered obese.  Maintain normal blood lipids and cholesterol by exercising and minimizing your intake of saturated fat. Eat a balanced diet with plenty of fruits and vegetables. Blood tests for lipids and cholesterol should begin at age 20 and be repeated every 5 years. If your lipid or cholesterol levels are high, you are over age 50, or you are at high risk for heart disease, you may need your cholesterol levels checked more frequently.Ongoing high lipid and cholesterol  levels should be treated with medicines if diet and exercise are not working.  If you smoke, find out from your health care provider how to quit. If you do not use tobacco, do not start.  Lung cancer screening is recommended for adults aged 55-80 years who are at high risk for developing lung cancer because of a history of smoking. A yearly low-dose CT scan of the lungs is recommended for people who have at least a 30-pack-year history of smoking and are current smokers or have quit within the past 15 years. A pack year of smoking is smoking an average of 1 pack of cigarettes a day for 1 year (for example, a 30-pack-year history of smoking could mean smoking 1 pack a day for 30 years or 2 packs a day for 15 years). Yearly screening should continue until the smoker has stopped smoking for at least 15 years. Yearly screening should be stopped for people who develop a health problem that would prevent them from having lung cancer treatment.  If you choose to drink alcohol, do not have more than 2 drinks per day. One drink is considered to be 12 oz (360 mL) of beer, 5 oz (150 mL) of wine, or 1.5 oz (45 mL) of liquor.  Avoid the use of street drugs. Do not share needles with anyone. Ask for help if you need support or instructions about stopping the use of drugs.  High blood pressure causes heart disease and increases the risk of stroke. High blood pressure is more likely to develop in:    People who have blood pressure in the end of the normal range (100-139/85-89 mm Hg).  People who are overweight or obese.  People who are African American.  If you are 18-39 years of age, have your blood pressure checked every 3-5 years. If you are 40 years of age or older, have your blood pressure checked every year. You should have your blood pressure measured twice--once when you are at a hospital or clinic, and once when you are not at a hospital or clinic. Record the average of the two measurements. To check your  blood pressure when you are not at a hospital or clinic, you can use:  An automated blood pressure machine at a pharmacy.  A home blood pressure monitor.  If you are 45-79 years old, ask your health care provider if you should take aspirin to prevent heart disease.  Diabetes screening involves taking a blood sample to check your fasting blood sugar level. This should be done once every 3 years after age 45 if you are at a normal weight and without risk factors for diabetes. Testing should be considered at a younger age or be carried out more frequently if you are overweight and have at least 1 risk factor for diabetes.  Colorectal cancer can be detected and often prevented. Most routine colorectal cancer screening begins at the age of 50 and continues through age 75. However, your health care provider may recommend screening at an earlier age if you have risk factors for colon cancer. On a yearly basis, your health care provider may provide home test kits to check for hidden blood in the stool. A small camera at the end of a tube may be used to directly examine the colon (sigmoidoscopy or colonoscopy) to detect the earliest forms of colorectal cancer. Talk to your health care provider about this at age 50 when routine screening begins. A direct exam of the colon should be repeated every 5-10 years through age 75, unless early forms of precancerous polyps or small growths are found.  People who are at an increased risk for hepatitis B should be screened for this virus. You are considered at high risk for hepatitis B if:  You were born in a country where hepatitis B occurs often. Talk with your health care provider about which countries are considered high risk.  Your parents were born in a high-risk country and you have not received a shot to protect against hepatitis B (hepatitis B vaccine).  You have HIV or AIDS.  You use needles to inject street drugs.  You live with, or have sex with,  someone who has hepatitis B.  You are a man who has sex with other men (MSM).  You get hemodialysis treatment.  You take certain medicines for conditions like cancer, organ transplantation, and autoimmune conditions.  Hepatitis C blood testing is recommended for all people born from 1945 through 1965 and any individual with known risk factors for hepatitis C.  Healthy men should no longer receive prostate-specific antigen (PSA) blood tests as part of routine cancer screening. Talk to your health care provider about prostate cancer screening.  Testicular cancer screening is not recommended for adolescents or adult males who have no symptoms. Screening includes self-exam, a health care provider exam, and other screening tests. Consult with your health care provider about any symptoms you have or any concerns you have about testicular cancer.  Practice safe sex. Use condoms and avoid high-risk sexual practices to reduce the spread of   sexually transmitted infections (STIs).  You should be screened for STIs, including gonorrhea and chlamydia if:  You are sexually active and are younger than 24 years.  You are older than 24 years, and your health care provider tells you that you are at risk for this type of infection.  Your sexual activity has changed since you were last screened, and you are at an increased risk for chlamydia or gonorrhea. Ask your health care provider if you are at risk.  If you are at risk of being infected with HIV, it is recommended that you take a prescription medicine daily to prevent HIV infection. This is called pre-exposure prophylaxis (PrEP). You are considered at risk if:  You are a man who has sex with other men (MSM).  You are a heterosexual man who is sexually active with multiple partners.  You take drugs by injection.  You are sexually active with a partner who has HIV.  Talk with your health care provider about whether you are at high risk of being  infected with HIV. If you choose to begin PrEP, you should first be tested for HIV. You should then be tested every 3 months for as long as you are taking PrEP.  Use sunscreen. Apply sunscreen liberally and repeatedly throughout the day. You should seek shade when your shadow is shorter than you. Protect yourself by wearing long sleeves, pants, a wide-brimmed hat, and sunglasses year round whenever you are outdoors.  Tell your health care provider of new moles or changes in moles, especially if there is a change in shape or color. Also, tell your health care provider if a mole is larger than the size of a pencil eraser.  A one-time screening for abdominal aortic aneurysm (AAA) and surgical repair of large AAAs by ultrasound is recommended for men aged 65-75 years who are current or former smokers.  Stay current with your vaccines (immunizations).   This information is not intended to replace advice given to you by your health care provider. Make sure you discuss any questions you have with your health care provider.   Document Released: 06/23/2007 Document Revised: 01/15/2014 Document Reviewed: 05/22/2010 Elsevier Interactive Patient Education 2016 [employer]vier Inc.  

## 2015-10-12 NOTE — Assessment & Plan Note (Signed)
Check psa, discussed; DRE next year with colonoscopy

## 2015-10-12 NOTE — Assessment & Plan Note (Signed)
USPSTF grade A and B recommendations reviewed with patient; age-appropriate recommendations, preventive care, screening tests, etc discussed and encouraged; healthy living encouraged; see AVS for patient education given to patient  

## 2015-10-12 NOTE — Assessment & Plan Note (Signed)
Check urine.

## 2015-10-13 LAB — COMPLETE METABOLIC PANEL WITH GFR

## 2015-10-13 LAB — CBC WITH DIFFERENTIAL/PLATELET

## 2015-10-13 LAB — MICROALBUMIN / CREATININE URINE RATIO: CREATININE, URINE: 61 mg/dL (ref 20–370)

## 2015-10-13 LAB — PSA

## 2015-10-13 LAB — LIPID PANEL

## 2015-10-14 ENCOUNTER — Other Ambulatory Visit: Payer: Self-pay | Admitting: Family Medicine

## 2015-10-14 LAB — COMPLETE METABOLIC PANEL WITH GFR
ALBUMIN: 4.7 g/dL (ref 3.6–5.1)
ALK PHOS: 44 U/L (ref 40–115)
ALT: 17 U/L (ref 9–46)
AST: 18 U/L (ref 10–40)
BILIRUBIN TOTAL: 1.1 mg/dL (ref 0.2–1.2)
BUN: 12 mg/dL (ref 7–25)
CALCIUM: 9.7 mg/dL (ref 8.6–10.3)
CO2: 25 mmol/L (ref 20–31)
Chloride: 101 mmol/L (ref 98–110)
Creat: 1.27 mg/dL (ref 0.60–1.35)
GFR, EST AFRICAN AMERICAN: 79 mL/min (ref >=60)
GFR, EST NON AFRICAN AMERICAN: 68 mL/min (ref >=60)
Glucose, Bld: 90 mg/dL (ref 65–99)
Potassium: 4.3 mmol/L (ref 3.5–5.3)
Sodium: 137 mmol/L (ref 135–146)
TOTAL PROTEIN: 7.8 g/dL (ref 6.1–8.1)

## 2015-10-14 LAB — CBC WITH DIFFERENTIAL/PLATELET
BASOS ABS: 0 {cells}/uL (ref 0–200)
Basophils Relative: 0 %
EOS ABS: 180 {cells}/uL (ref 15–500)
EOS PCT: 3 %
HCT: 42.9 % (ref 38.5–50.0)
Hemoglobin: 15.1 g/dL (ref 13.2–17.1)
LYMPHS PCT: 53 %
Lymphs Abs: 3180 cells/uL (ref 850–3900)
MCH: 31.1 pg (ref 27.0–33.0)
MCHC: 35.2 g/dL (ref 32.0–36.0)
MCV: 88.3 fL (ref 80.0–100.0)
MONOS PCT: 5 %
MPV: 12 fL (ref 7.5–12.5)
Monocytes Absolute: 300 cells/uL (ref 200–950)
NEUTROS ABS: 2340 {cells}/uL (ref 1500–7800)
NEUTROS PCT: 39 %
Platelets: 164 10*3/uL (ref 140–400)
RBC: 4.86 MIL/uL (ref 4.20–5.80)
RDW: 13.3 % (ref 11.0–15.0)
WBC: 6 10*3/uL (ref 3.8–10.8)

## 2015-10-14 LAB — LIPID PANEL
CHOL/HDL RATIO: 3.6 ratio (ref <=5.0)
CHOLESTEROL: 241 mg/dL — AB (ref 125–200)
HDL: 67 mg/dL (ref >=40)
LDL Cholesterol: 154 mg/dL — ABNORMAL HIGH (ref <130)
Triglycerides: 100 mg/dL (ref <150)
VLDL: 20 mg/dL (ref <30)

## 2015-10-14 LAB — PSA: PSA: 1.7 ng/mL (ref <=4.0)

## 2016-09-04 ENCOUNTER — Encounter: Payer: Self-pay | Admitting: Family Medicine

## 2017-05-16 IMAGING — CT CT ABD-PEL WO/W CM
1 of 3 series · 13 of 32 positions shown, 19 images · IV contrast (omnipaque)
Comparison: None.

CLINICAL DATA: Microscopic hematuria.

EXAM:
CT ABDOMEN AND PELVIS WITHOUT AND WITH CONTRAST
TECHNIQUE: Multidetector CT imaging of the abdomen and pelvis was performed
following the standard protocol before and following the bolus
administration of intravenous contrast.
CONTRAST:  150mL OMNIPAQUE IOHEXOL 350 MG/ML SOLN

[Series 5: axial delay · axial · delayed · 0.67mm/px · z∈[-988,-562]mm · 13 of 101 slices shown, 19 images]
[im 8/101  soft-tissue]
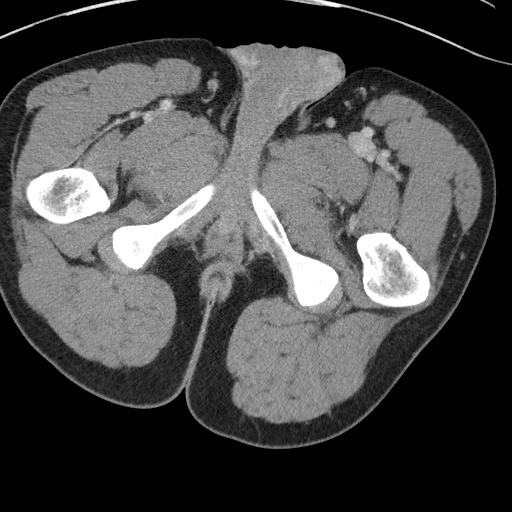
[im 8/101  bone]
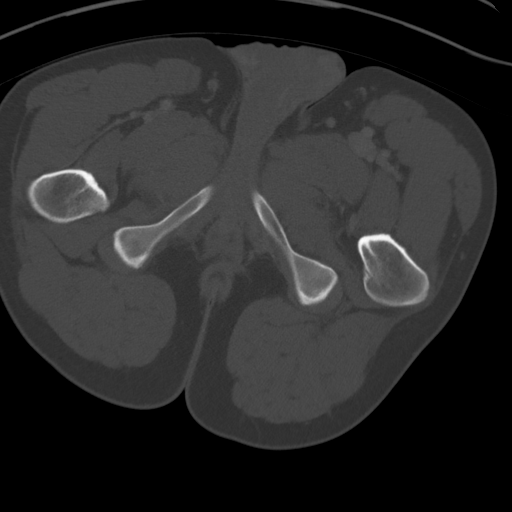
[im 15/101  soft-tissue]
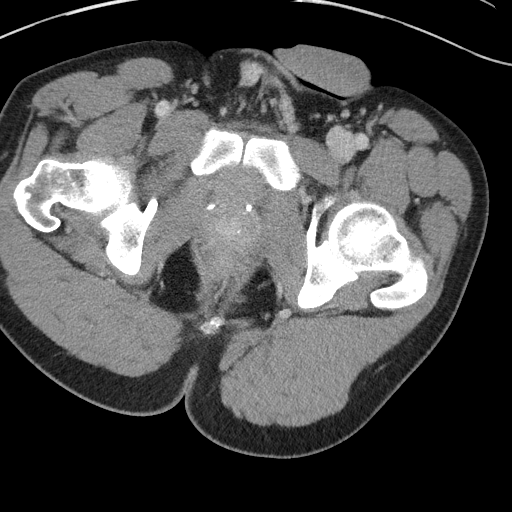
[im 22/101  soft-tissue]
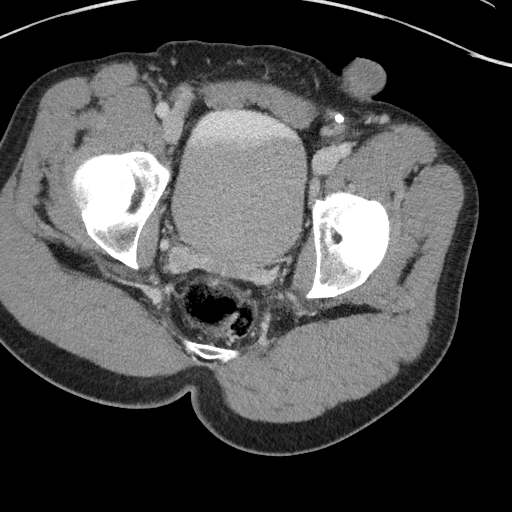
[im 29/101  soft-tissue]
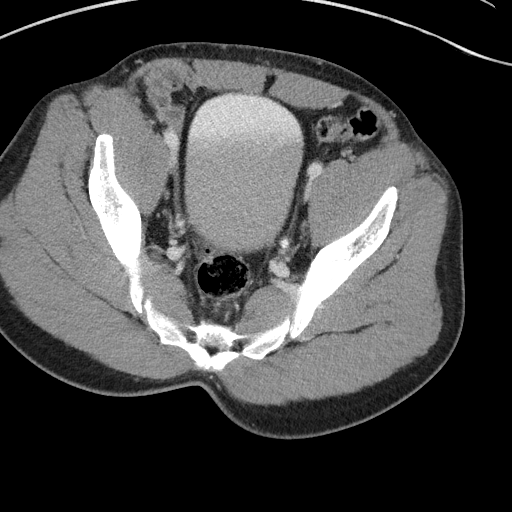
[im 36/101  soft-tissue]
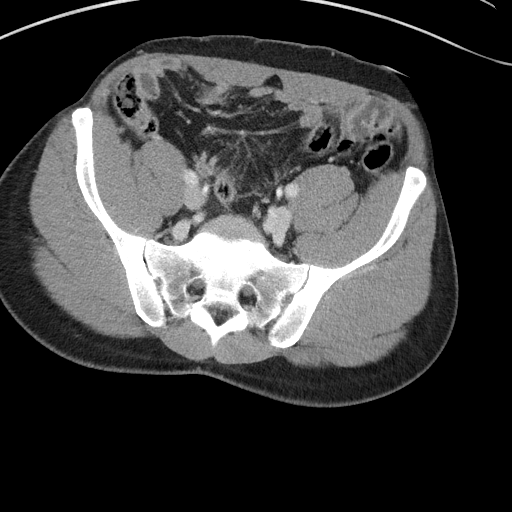
[im 43/101  soft-tissue]
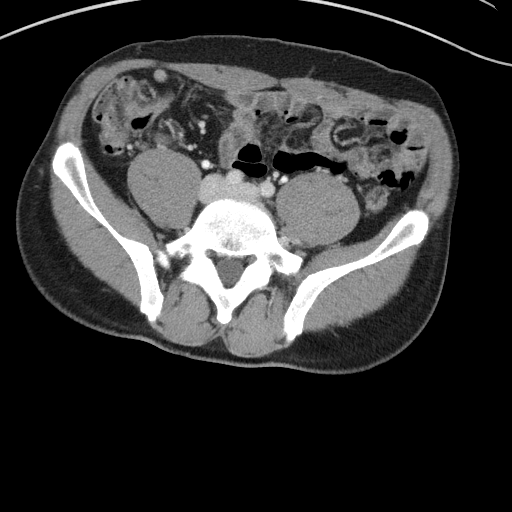
[im 51/101  soft-tissue]
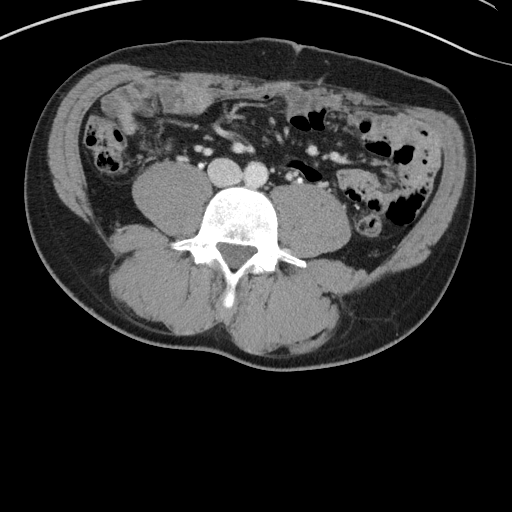
[im 58/101  soft-tissue]
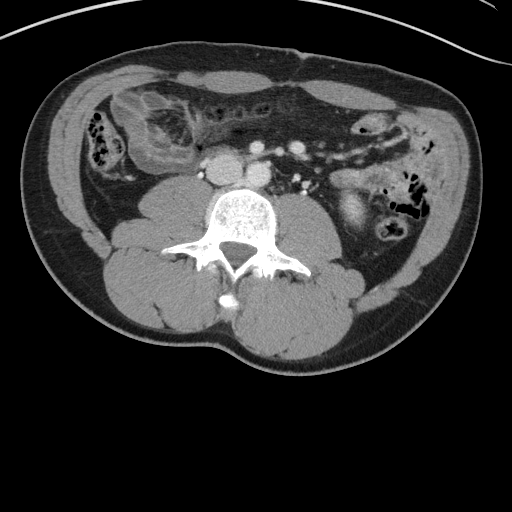
[im 65/101  soft-tissue]
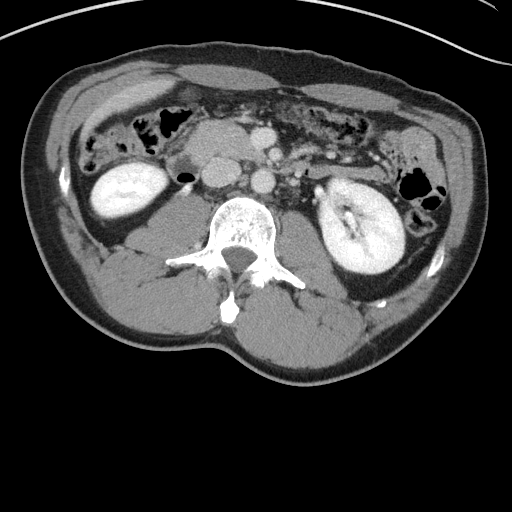
[im 65/101  bone]
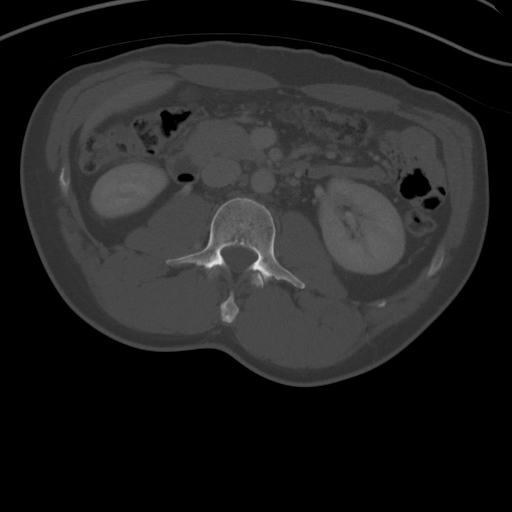
[im 72/101  soft-tissue]
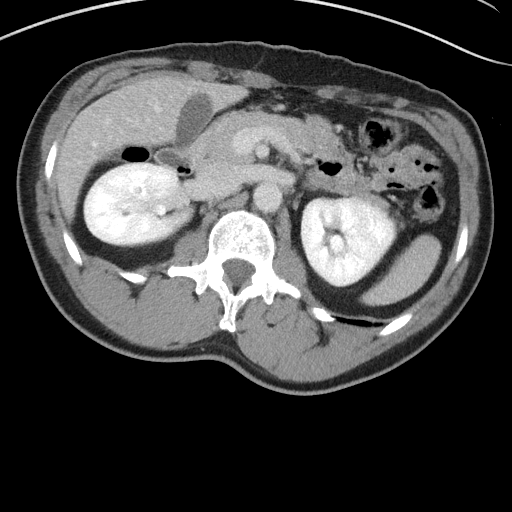
[im 72/101  lung]
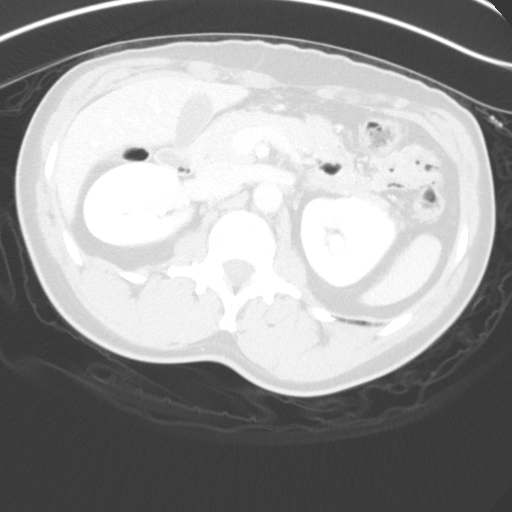
[im 79/101  soft-tissue]
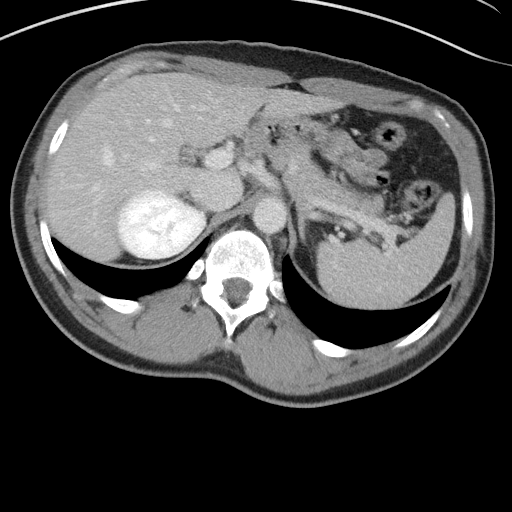
[im 79/101  lung]
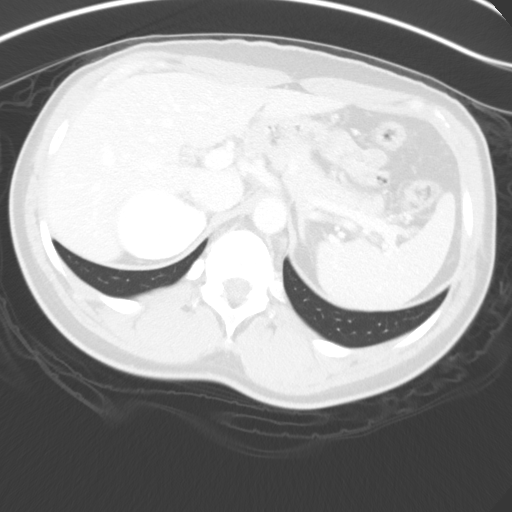
[im 86/101  soft-tissue]
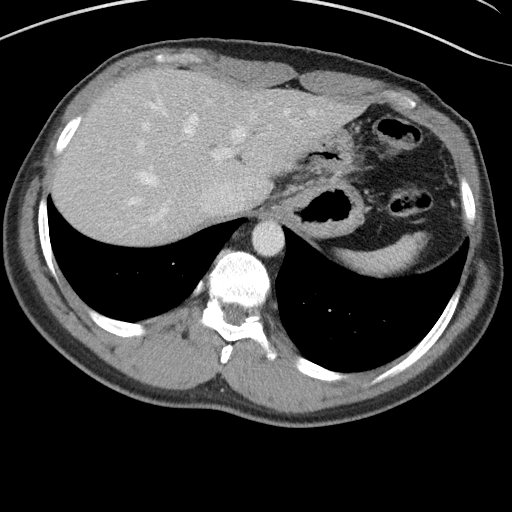
[im 86/101  lung]
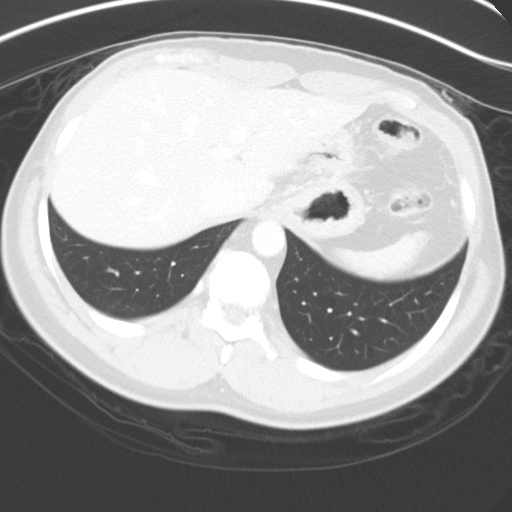
[im 93/101  soft-tissue]
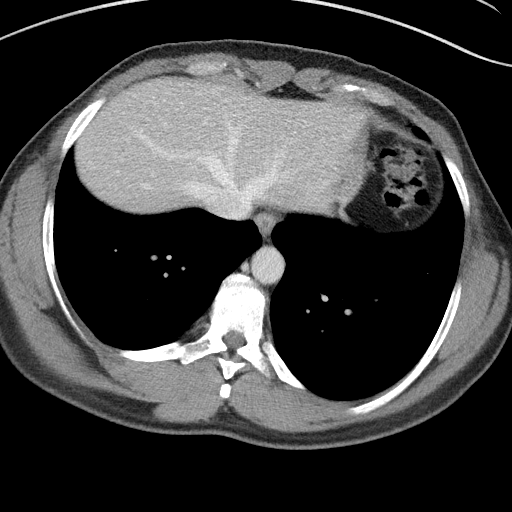
[im 93/101  lung]
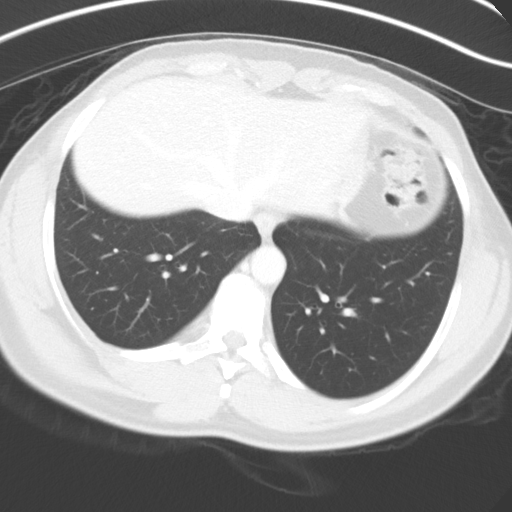

[13 of 32 positions shown; findings below may reference images not displayed]

FINDINGS: Lower chest: No significant pulmonary nodules or acute consolidative
airspace disease.

Hepatobiliary: Normal liver with no liver mass. Normal gallbladder
with no radiopaque cholelithiasis. No biliary ductal dilatation.

Pancreas: Normal, with no mass or duct dilation.

Spleen: Normal size. No mass.

Adrenals/Urinary Tract: Normal adrenals. No hydronephrosis. No renal
stones. Normal caliber ureters, with no ureteral stones. Hypodense
0.5 cm renal cortical lesion in the anterior lower right kidney, too
small to characterize. Two hypodense subcentimeter renal cortical
lesions in the left kidney, too small to characterize. On delayed
imaging, there is no urothelial wall thickening and there are no
filling defects in the opacified portions of the bilateral
collecting systems or ureters. Normal bladder.

Stomach/Bowel: Grossly normal stomach. Normal caliber small bowel
with no small bowel wall thickening. Appendix is within normal
limits. Normal large bowel with no diverticulosis, large bowel wall
thickening or pericolonic fat stranding.

Vascular/Lymphatic: Normal caliber abdominal aorta. Patent portal,
splenic, hepatic and renal veins. No pathologically enlarged lymph
nodes in the abdomen or pelvis.

Reproductive: Top-normal size prostate.

Other: No pneumoperitoneum, ascites or focal fluid collection.

Musculoskeletal: No aggressive appearing focal osseous lesions.
IMPRESSION: No CT findings to explain the hematuria. No urolithiasis. No
evidence of urinary tract obstruction. Subcentimeter too small to
characterize hypodense renal cortical lesions, with no overtly
suspicious renal cortical masses. No urothelial lesions.

## 2018-12-08 ENCOUNTER — Ambulatory Visit: Payer: Self-pay | Admitting: Urology

## 2021-02-28 ENCOUNTER — Telehealth: Admit: 2021-02-28 | Payer: PRIVATE HEALTH INSURANCE

## 2021-02-28 ENCOUNTER — Encounter: Admit: 2021-02-28 | Payer: PRIVATE HEALTH INSURANCE

## 2021-02-28 NOTE — Telephone Encounter
Referral noted in queue.  Call placed to patient to discuss living donation.  Voicemail reached, message left introducing myself and the reason for the call.  Call back number provided to further discuss interest regarding living liver donation.  Introductory email with contact information also sent.

## 2021-03-07 ENCOUNTER — Telehealth: Admit: 2021-03-07 | Payer: PRIVATE HEALTH INSURANCE

## 2021-03-07 NOTE — Telephone Encounter
Called?Isaac Powell?(from Referral work queue) to discuss liver living donation?process?and to go over with the screening questionnaire. Spoke to patient , he stated that he is not available at this time due to work. The best time to talk to him would be 8:00 AM tomorrow. Told patient that this nurse will call him tomorrow morning. He verbalized understanding of the information.

## 2021-03-08 ENCOUNTER — Telehealth: Admit: 2021-03-08 | Payer: PRIVATE HEALTH INSURANCE

## 2021-03-08 NOTE — Telephone Encounter
Tried to call patient this morning to go over with the process for liver living donation, unable to reach patient. Left message on his VM to call back. Contact number provided.

## 2021-03-10 ENCOUNTER — Telehealth: Admit: 2021-03-10 | Payer: PRIVATE HEALTH INSURANCE

## 2021-03-10 NOTE — Telephone Encounter
Called?Isaac Powell at 8:10 AM ?(from Referral work queue) to discuss liver living donation?process?and to go over with the screening questionnaire. Unable to reach him, left message on his VM to call back. Contact number provided. Email sent as well.

## 2021-03-13 ENCOUNTER — Telehealth: Admit: 2021-03-13 | Payer: PRIVATE HEALTH INSURANCE

## 2021-03-13 NOTE — Telephone Encounter
Call placed to patient to discuss his interest in live liver donation.  He was provided overall education about the process, surgery and recovery.  He notes that this is tax season and has a great deal of difficulty staying on the phone during the day, but would be willing to speak about 8 am.  We discussed Wednesday and Thursday as options to speaking further.  He notes Wednesday is better.  He was advised that this coordinator will do everything possible to make Wednesday happen.  He was advised that this coordinator will be reaching out Wednesday morning.  He verbalized understanding.

## 2021-03-15 ENCOUNTER — Telehealth: Admit: 2021-03-15 | Payer: PRIVATE HEALTH INSURANCE

## 2021-03-15 NOTE — Telephone Encounter
Living Liver Donor Initial Referral Note:Isaac Powell is a 51 y.o. male who has contacted our referral line voluntarily in order to be considered as a potential living liver donor.ABO: B- Not on FileNo past medical history on file.No family history on file.Notes: He was informed that he is not a blood type match for this recipient.  He was asked to share the recipient's story and to share with others what he does know about living donation.  He verbalized understanding and intent to share with others.    Plan:Decline for abo incompatibility.
